# Patient Record
Sex: Male | Born: 1980 | Race: White | Hispanic: No | Smoking: Current some day smoker
Health system: Southern US, Community
[De-identification: ages and names within clinical notes are randomized; demographics above are authoritative.]

## PROBLEM LIST (undated history)

## (undated) DIAGNOSIS — J45909 Unspecified asthma, uncomplicated: Secondary | ICD-10-CM

## (undated) HISTORY — PX: ADENOIDECTOMY: SUR15

---

## 2007-03-22 ENCOUNTER — Emergency Department: Payer: Self-pay | Admitting: Emergency Medicine

## 2007-07-04 ENCOUNTER — Emergency Department: Payer: Self-pay | Admitting: Emergency Medicine
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
## 2016-06-27 ENCOUNTER — Institutional Professional Consult (permissible substitution): Payer: Self-pay | Admitting: Pulmonary Disease

## 2016-07-08 ENCOUNTER — Institutional Professional Consult (permissible substitution): Payer: Self-pay | Admitting: Internal Medicine

## 2016-07-10 ENCOUNTER — Institutional Professional Consult (permissible substitution): Payer: Self-pay | Admitting: Internal Medicine

## 2016-07-22 NOTE — Progress Notes (Signed)
[facility]      Assessment and Plan:  Lung Nodule --on review of CXR from 06/21/16; there is a small l(about 5mm) eft lung nodule, however he was told by urgent care that he has a 2.5 cm nodule. Will give to our radiologist for interpretation and schedule follow up in 6 months.   Nicotine Abuse --discussed > 3 min regarding smoking cessation.   Acute bronchitis.  --Doing better but not quite back to baseline.  --Given samples of dulera 100, asked to use 2 puffs twice daily.  --Can give prescription if he still feels that he needs them when they run out in about a month.   Date: 07/22/2016  MRN# [id number] Gregory PartridgeMichael P Schlossberg Jr. [birth year]  Referring Physician:   Asencion PartridgeMichael P Cadena Jr. is a 35 y.o. old male seen in consultation for chief complaint of:    Chief Complaint  Patient presents with  . Advice Only    bronchitis: UC found area on lung: NP cough: SOB previously: chest pain in center of chest    HPI:   Pt is a self referral, she went to urgent care with bronchitis symptoms and had imaging which apparently showed a spot on her lung.  He had an episode of bronchitis with dyspnea, bad cough and chest pain. He got to the point that he could not breathe. He went to urgent care and got put on zpack, a prn inhaler of some some which he used for a week every hour. He also had a CXR which he was told that was abnormal.   He is smoking about ppd;  He has quit for a week in the past with patches, he has worked in [employer] in the past, and he works in [employer] and has been exposed to asbestos.   He has never been diagnosed with asthma or COPD. His dad had epilepsy and asthma. He has no immediate family members with cancer.   CT chest report 07/04/07 report, no images available. Negative CT of the chest, abdomen and pelvis.  Specifically, there is no  posttraumatic change. The mediastinum and thoracic aorta are normal. There  is no pneumothorax. No  intraabdominal or intrapelvic significant  abnormalities are identified.   PMHX:   History reviewed. No pertinent past medical history. Surgical Hx:  Past Surgical History:  Procedure Laterality Date  . ADENOIDECTOMY     Family Hx:  Family History  Problem Relation Age of Onset  . Asthma Maternal Grandmother   . Lung cancer Maternal Grandmother   . Emphysema Maternal Grandmother   . Asthma Paternal Grandmother   . Lung cancer Paternal Grandmother   . Emphysema Paternal Grandmother    Social Hx:   Social History  Substance Use Topics  . Smoking status: Not on file  . Smokeless tobacco: Not on file  . Alcohol use Not on file   Medication:       Allergies:  Patient has no known allergies.  Review of Systems: Gen:  Denies  fever, sweats, chills HEENT: Denies blurred vision, double vision. bleeds, sore throat Cvc:  No dizziness, chest pain. Resp:   Denies cough or sputum production, shortness of breath Gi: Denies swallowing difficulty, stomach pain. Gu:  Denies bladder incontinence, burning urine Ext:   No Joint pain, stiffness. Skin: No skin rash,  hives  Endoc:  No polyuria, polydipsia. Psych: No depression, insomnia. Other:  All other systems were reviewed with the patient and were negative other that what is  mentioned in the HPI.   Physical Examination:   VS: BP 118/60 (BP Location: Left Arm, Cuff Size: Normal)   Pulse 72   Ht 5\' 9"  (1.753 m)   Wt 149 lb (67.6 kg)   SpO2 96%   BMI 22.00 kg/m   General Appearance: No distress. Rash on anterior and posterior torso.  Neuro:without focal findings,  speech normal,  HEENT: PERRLA, EOM intact.   Pulmonary: normal breath sounds, No wheezing.  CardiovascularNormal S1,S2.  No m/r/g.   Abdomen: Benign, Soft, non-tender. Renal:  No costovertebral tenderness  GU:  No performed at this time. Endoc: No evident thyromegaly, no signs of acromegaly. Skin:   warm, no rashes, no ecchymosis  Extremities: normal, no  cyanosis, clubbing.  Other findings:    LABORATORY PANEL:   CBC No results for input(s): WBC, HGB, HCT, PLT in the last 168 hours. ------------------------------------------------------------------------------------------------------------------  Chemistries  No results for input(s): NA, K, CL, CO2, GLUCOSE, BUN, CREATININE, CALCIUM, MG, AST, ALT, ALKPHOS, BILITOT in the last 168 hours.  Invalid input(s): GFRCGP ------------------------------------------------------------------------------------------------------------------  Cardiac Enzymes No results for input(s): TROPONINI in the last 168 hours. ------------------------------------------------------------  RADIOLOGY:  No results found.     Thank  you for the consultation and for allowing [facility] to assist in the care of your patient. Our recommendations are noted above.  Please contact us if we can be of further service.   Wells Guileseep Alice Burnside, MD.  Board Certified in Internal Medicine, Pulmonary Medicine, Critical Care Medicine, and Sleep Medicine.  [facility] Office Number: [phone]  Santiago Gladavid Kasa, M.D.  Stephanie AcreVishal Mungal, M.D.  Billy Fischeravid Simonds, M.D  07/22/2016

## 2016-07-23 ENCOUNTER — Encounter: Payer: Self-pay | Admitting: Internal Medicine

## 2016-07-23 ENCOUNTER — Ambulatory Visit (INDEPENDENT_AMBULATORY_CARE_PROVIDER_SITE_OTHER): Payer: Self-pay | Admitting: Internal Medicine

## 2016-07-23 ENCOUNTER — Encounter (INDEPENDENT_AMBULATORY_CARE_PROVIDER_SITE_OTHER): Payer: Self-pay

## 2016-07-23 VITALS — BP 118/60 | HR 72 | Ht 69.0 in | Wt 149.0 lb

## 2016-07-23 DIAGNOSIS — Z720 Tobacco use: Secondary | ICD-10-CM

## 2016-07-23 DIAGNOSIS — J209 Acute bronchitis, unspecified: Secondary | ICD-10-CM

## 2016-07-23 MED ORDER — MOMETASONE FURO-FORMOTEROL FUM 100-5 MCG/ACT IN AERO: 2.0000 | INHALATION_SPRAY | Freq: Two times a day (BID) | RESPIRATORY_TRACT | 0 refills | Status: DC

## 2016-07-23 NOTE — Patient Instructions (Addendum)
--  Quitting smoking is the most important thing that you can do for your health.   --The best way to quit is to set a quit date, start a nicotine replacement 1 week before.   --Will start dulera 100 samples 2 puffs twice daily. Rinse mouth or brush teeth after use.

## 2016-07-24 ENCOUNTER — Ambulatory Visit
Admission: RE | Admit: 2016-07-24 | Discharge: 2016-07-24 | Disposition: A | Discharge status: Home / Self Care | Payer: Self-pay | Source: Ambulatory Visit | Attending: Internal Medicine | Admitting: Internal Medicine

## 2016-07-24 ENCOUNTER — Other Ambulatory Visit: Payer: Self-pay | Admitting: Internal Medicine

## 2016-07-24 DIAGNOSIS — R0602 Shortness of breath: Secondary | ICD-10-CM

## 2017-01-16 ENCOUNTER — Inpatient Hospital Stay
Admission: EM | Admit: 2017-01-16 | Discharge: 2017-01-18 | DRG: 603 | Disposition: A | Discharge status: Home / Self Care | Payer: Self-pay | Attending: Internal Medicine | Admitting: Internal Medicine

## 2017-01-16 ENCOUNTER — Encounter: Payer: Self-pay | Admitting: Internal Medicine

## 2017-01-16 DIAGNOSIS — J45909 Unspecified asthma, uncomplicated: Secondary | ICD-10-CM | POA: Diagnosis present

## 2017-01-16 DIAGNOSIS — Z825 Family history of asthma and other chronic lower respiratory diseases: Secondary | ICD-10-CM

## 2017-01-16 DIAGNOSIS — L0292 Furuncle, unspecified: Secondary | ICD-10-CM | POA: Diagnosis present

## 2017-01-16 DIAGNOSIS — Z801 Family history of malignant neoplasm of trachea, bronchus and lung: Secondary | ICD-10-CM

## 2017-01-16 DIAGNOSIS — L03211 Cellulitis of face: Principal | ICD-10-CM | POA: Diagnosis present

## 2017-01-16 DIAGNOSIS — Z7951 Long term (current) use of inhaled steroids: Secondary | ICD-10-CM

## 2017-01-16 DIAGNOSIS — F17200 Nicotine dependence, unspecified, uncomplicated: Secondary | ICD-10-CM | POA: Diagnosis present

## 2017-01-16 HISTORY — DX: Unspecified asthma, uncomplicated: J45.909

## 2017-01-16 LAB — CBC WITH DIFFERENTIAL/PLATELET
Basophils Absolute: 0.1 10*3/uL (ref 0–0.1)
Basophils Relative: 0 %
Eosinophils Absolute: 1 10*3/uL — ABNORMAL HIGH (ref 0–0.7)
Eosinophils Relative: 7 %
HEMATOCRIT: 39.6 % — AB (ref 40.0–52.0)
Hemoglobin: 13.5 g/dL (ref 13.0–18.0)
LYMPHS ABS: 1.8 10*3/uL (ref 1.0–3.6)
LYMPHS PCT: 12 %
MCH: 32.6 pg (ref 26.0–34.0)
MCHC: 34.1 g/dL (ref 32.0–36.0)
MCV: 95.7 fL (ref 80.0–100.0)
MONO ABS: 1.6 10*3/uL — AB (ref 0.2–1.0)
Monocytes Relative: 11 %
NEUTROS ABS: 10.5 10*3/uL — AB (ref 1.4–6.5)
Neutrophils Relative %: 70 %
Platelets: 254 10*3/uL (ref 150–440)
RBC: 4.14 MIL/uL — ABNORMAL LOW (ref 4.40–5.90)
RDW: 13.6 % (ref 11.5–14.5)
WBC: 15 10*3/uL — ABNORMAL HIGH (ref 3.8–10.6)

## 2017-01-16 LAB — BASIC METABOLIC PANEL
ANION GAP: 5 (ref 5–15)
BUN: 11 mg/dL (ref 6–20)
CHLORIDE: 103 mmol/L (ref 101–111)
CO2: 32 mmol/L (ref 22–32)
Calcium: 8.8 mg/dL — ABNORMAL LOW (ref 8.9–10.3)
Creatinine, Ser: 0.8 mg/dL (ref 0.61–1.24)
GFR calc Af Amer: >60 mL/min (ref >60)
GLUCOSE: 99 mg/dL (ref 65–99)
POTASSIUM: 3.9 mmol/L (ref 3.5–5.1)
SODIUM: 140 mmol/L (ref 135–145)

## 2017-01-16 MED ORDER — ONDANSETRON HCL 4 MG PO TABS: 4.0000 mg | ORAL_TABLET | Freq: Four times a day (QID) | ORAL | Status: DC | PRN

## 2017-01-16 MED ORDER — CLINDAMYCIN PHOSPHATE 600 MG/50ML IV SOLN: 600.0000 mg | Freq: Once | INTRAVENOUS | Status: DC

## 2017-01-16 MED ORDER — OXYCODONE HCL 5 MG PO TABS
5.0000 mg | ORAL_TABLET | ORAL | Status: DC | PRN
  Administered 2017-01-16 – 2017-01-18 (×8): 5 mg via ORAL
  Filled 2017-01-16 (×8): qty 1

## 2017-01-16 MED ORDER — SODIUM CHLORIDE 0.9 % IV BOLUS (SEPSIS)
1000.0000 mL | Freq: Once | INTRAVENOUS | Status: AC
  Administered 2017-01-16: 1000 mL via INTRAVENOUS

## 2017-01-16 MED ORDER — PIPERACILLIN-TAZOBACTAM 3.375 G IVPB 30 MIN
3.3750 g | Freq: Once | INTRAVENOUS | Status: AC
  Administered 2017-01-16: 3.375 g via INTRAVENOUS
  Filled 2017-01-16: qty 50

## 2017-01-16 MED ORDER — VANCOMYCIN HCL IN DEXTROSE 1-5 GM/200ML-% IV SOLN
1000.0000 mg | Freq: Once | INTRAVENOUS | Status: AC
  Administered 2017-01-16: 1000 mg via INTRAVENOUS
  Filled 2017-01-16: qty 200

## 2017-01-16 MED ORDER — VANCOMYCIN HCL IN DEXTROSE 1-5 GM/200ML-% IV SOLN
1000.0000 mg | Freq: Three times a day (TID) | INTRAVENOUS | Status: DC
  Administered 2017-01-17 – 2017-01-18 (×4): 1000 mg via INTRAVENOUS
  Filled 2017-01-16 (×6): qty 200

## 2017-01-16 MED ORDER — MORPHINE SULFATE (PF) 4 MG/ML IV SOLN
4.0000 mg | Freq: Once | INTRAVENOUS | Status: AC
  Administered 2017-01-16: 4 mg via INTRAVENOUS
  Filled 2017-01-16: qty 1

## 2017-01-16 MED ORDER — ACETAMINOPHEN 650 MG RE SUPP: 650.0000 mg | Freq: Four times a day (QID) | RECTAL | Status: DC | PRN

## 2017-01-16 MED ORDER — ACETAMINOPHEN 325 MG PO TABS
650.0000 mg | ORAL_TABLET | Freq: Four times a day (QID) | ORAL | Status: DC | PRN
  Administered 2017-01-17: 650 mg via ORAL
  Filled 2017-01-16: qty 2

## 2017-01-16 MED ORDER — PIPERACILLIN-TAZOBACTAM 3.375 G IVPB
3.3750 g | Freq: Three times a day (TID) | INTRAVENOUS | Status: DC
  Administered 2017-01-17 – 2017-01-18 (×4): 3.375 g via INTRAVENOUS
  Filled 2017-01-16 (×6): qty 50

## 2017-01-16 MED ORDER — MORPHINE SULFATE (PF) 4 MG/ML IV SOLN
4.0000 mg | INTRAVENOUS | Status: DC | PRN
Start: 2017-01-16 — End: 2017-01-18
  Administered 2017-01-17 – 2017-01-18 (×4): 4 mg via INTRAVENOUS
  Filled 2017-01-16 (×4): qty 1

## 2017-01-16 MED ORDER — ONDANSETRON HCL 4 MG/2ML IJ SOLN: 4.0000 mg | Freq: Four times a day (QID) | INTRAMUSCULAR | Status: DC | PRN

## 2017-01-16 MED ORDER — ENOXAPARIN SODIUM 40 MG/0.4ML ~~LOC~~ SOLN
40.0000 mg | SUBCUTANEOUS | Status: DC
  Administered 2017-01-16: 40 mg via SUBCUTANEOUS
  Filled 2017-01-16: qty 0.4

## 2017-01-16 MED ORDER — MOMETASONE FURO-FORMOTEROL FUM 100-5 MCG/ACT IN AERO
2.0000 | INHALATION_SPRAY | Freq: Two times a day (BID) | RESPIRATORY_TRACT | Status: DC
  Administered 2017-01-17 (×2): 2 via RESPIRATORY_TRACT
  Filled 2017-01-16: qty 8.8

## 2017-01-16 NOTE — ED Notes (Signed)
Large amount swelling noted to right side of face around right eye; charge called for exam room

## 2017-01-16 NOTE — ED Triage Notes (Addendum)
Pt presents to ED with right sided facial swelling. Pt reports pain in right jaw but denies having dental problems. Pt reports having had a similar reaction to the last time he used an Neurosurgeonelectric razor. Pt has small area of broken skin on cheek that is red and swollen. Swelling noted around pts eye. Pt deneis visual changes. PT denies pain in throat, neck or when swallowing. No SOB or difficulty breathing.

## 2017-01-16 NOTE — ED Notes (Signed)
Zosyn complete. vanco attached and clamped and ready for transport to the floor.

## 2017-01-16 NOTE — Progress Notes (Addendum)
Pharmacy Antibiotic Note  Gregory PartridgeMichael P Penrod Jr. is a 36 y.o. male admitted on 01/16/2017 with cellulitis.  Pharmacy has been consulted for vancomycin and Zosyn dosing.  Plan: 73 kg   Vd 51L kei 0.1 hr-1  t1/2 7 hours Vancomycin 1 gram q 8 hours ordered with stacked dosing. Level before 5th dose.  Goal trough 10-15.  Zosyn 3.375 grams q 8 hours ordered.    Height: 5\' 9"  (175.3 cm) Weight: 150 lb (68 kg) IBW/kg (Calculated) : 70.7  Temp (24hrs), Avg:97.9 F (36.6 C), Min:97.9 F (36.6 C), Max:97.9 F (36.6 C)   Recent Labs Lab 01/16/17 2100  WBC 15.0*  CREATININE 0.80    Estimated Creatinine Clearance: 121.6 mL/min (by C-G formula based on SCr of 0.8 mg/dL).    No Known Allergies  Antimicrobials this admission: vancomycin Zosyn >>    >>   Dose adjustments this admission: 5/5 03:30 vanc level 21, ~ 5 hour level. Will reschedule level before 14:00 dose for a more representative trough.   Microbiology results: 5/3 WoundCx: pending    Thank you for allowing pharmacy to be a part of this patient's care.  Aseel Truxillo S 01/16/2017 10:23 PM

## 2017-01-16 NOTE — ED Notes (Signed)
Pt given ginger ale, and medicated as ordered.

## 2017-01-16 NOTE — ED Provider Notes (Signed)
[facility] Emergency Department Provider Note  ____________________________________________  Time seen: Approximately 10:16 PM  I have reviewed the triage vital signs and the nursing notes.   HISTORY  Chief Complaint Facial Swelling    HPI Gregory PartridgeMichael P Booz Jr. is a 36 y.o. male reports worsening swelling and pain of the face over the past couple of days. Reports that from skin cuts from shaving he first developed a swollen area under the left side of the jaw. Then since last night is been having more pain and swelling under the right side of the jaw on the right side of the face extending up to below the eye. Denies any vision changes or eye pain. No pain with movement of the eyes. No headache or dizziness .     Past Medical History:  Diagnosis Date  . Asthma      Patient Active Problem List   Diagnosis Date Noted  . Facial cellulitis 01/16/2017  . Asthma 01/16/2017     Past Surgical History:  Procedure Laterality Date  . ADENOIDECTOMY       Prior to Admission medications   Not on File  None   Allergies Patient has no known allergies.   Family History  Problem Relation Age of Onset  . Asthma Maternal Grandmother   . Lung cancer Maternal Grandmother   . Emphysema Maternal Grandmother   . Asthma Paternal Grandmother   . Lung cancer Paternal Grandmother   . Emphysema Paternal Grandmother     Social History Social History  Substance Use Topics  . Smoking status: Current Some Day Smoker    Packs/day: 1.00  . Smokeless tobacco: Never Used  . Alcohol use No    Review of Systems  Constitutional:   No fever or chills.  ENT:   No sore throat. No rhinorrhea.Positive facial pain. Difficulty eating due to pain with movement of the jaw Lymphatic: No swollen glands, No extremity swelling Endocrine: No hot/cold flashes. No significant weight change. No neck swelling. Cardiovascular:   No chest pain or syncope. Respiratory:   No  dyspnea or cough. Gastrointestinal:   Negative for abdominal pain, vomiting and diarrhea.  Genitourinary:   Negative for dysuria or difficulty urinating. Musculoskeletal:   Negative for focal pain or swelling Neurological:   Negative for headaches or weakness. All other systems reviewed and are negative except as documented above in ROS and HPI.  ____________________________________________   PHYSICAL EXAM:  VITAL SIGNS: ED Triage Vitals  Enc Vitals Group     BP 01/16/17 2018 131/66     Pulse Rate 01/16/17 2018 86     Resp 01/16/17 2018 16     Temp 01/16/17 2018 97.9 F (36.6 C)     Temp Source 01/16/17 2018 Oral     SpO2 01/16/17 2018 100 %     Weight 01/16/17 2019 160 lb (72.6 kg)     Height 01/16/17 2019 5\' 9"  (1.753 m)     Head Circumference --      Peak Flow --      Pain Score 01/16/17 2018 7     Pain Loc --      Pain Edu? --      Excl. in GC? --     Vital signs reviewed, nursing assessments reviewed.   Constitutional:   Alert and oriented. Not in distress. Eyes:   No scleral icterus. No conjunctival pallor. PERRL. EOMI.  No nystagmus. ENT   Head:   Normocephalic and atraumatic. 2-3 cm nodular  mass in the left submandibular space, possibly early organizing abscess but without a significant fluid collection for drainage. Right face has an area of purulent drainage and tenderness on the right maxilla just lateral to the naris. There is some slight puffy swelling extending up to the lower eyelid on the right. Globe is uninvolved and nontender.    Nose:   No congestion/rhinnorhea. No septal hematoma   Mouth/Throat:   MMM, no pharyngeal erythema. No peritonsillar mass.    Neck:   No stridor. No SubQ emphysema. No meningismus. Hematological/Lymphatic/Immunilogical:   No cervical lymphadenopathy. Cardiovascular:   RRR. Symmetric bilateral radial and DP pulses.  No murmurs.  Respiratory:   Normal respiratory effort without tachypnea nor retractions. Breath sounds  are clear and equal bilaterally. No wheezes/rales/rhonchi. Gastrointestinal:   Soft and nontender. Non distended. There is no CVA tenderness.  No rebound, rigidity, or guarding. Genitourinary:   deferred Musculoskeletal:   Normal range of motion in all extremities. No joint effusions.  No lower extremity tenderness.  No edema. Neurologic:   Normal speech and language.  CN 2-10 normal. Motor grossly intact. No gross focal neurologic deficits are appreciated.  Skin:    Skin is warm, dry and intact. No rash noted.  No petechiae, purpura, or bullae.  ____________________________________________    LABS (pertinent positives/negatives) (all labs ordered are listed, but only abnormal results are displayed) Labs Reviewed  BASIC METABOLIC PANEL - Abnormal; Notable for the following:       Result Value   Calcium 8.8 (*)    All other components within normal limits  CBC WITH DIFFERENTIAL/PLATELET - Abnormal; Notable for the following:    WBC 15.0 (*)    RBC 4.14 (*)    HCT 39.6 (*)    Neutro Abs 10.5 (*)    Monocytes Absolute 1.6 (*)    Eosinophils Absolute 1.0 (*)    All other components within normal limits  AEROBIC CULTURE (SUPERFICIAL SPECIMEN)  CBC  CREATININE, SERUM  BASIC METABOLIC PANEL  CBC   ____________________________________________   EKG    ____________________________________________    RADIOLOGY  No results found.  ____________________________________________   PROCEDURES Procedures  ____________________________________________   INITIAL IMPRESSION / ASSESSMENT AND PLAN / ED COURSE  Pertinent labs & imaging results that were available during my care of the patient were reviewed by me and considered in my medical decision making (see chart for details).       Clinical Course as of Jan 17 2215  Thu Jan 16, 2017  2121 Facial cellulitis with purulent drainage. Clinically no orbital cellulitis. Will start vanc/zosyn. Plan to admit.   [PS]     Clinical Course User Index [PS] Sharman Cheek, MD     ____________________________________________   FINAL CLINICAL IMPRESSION(S) / ED DIAGNOSES  Final diagnoses:  Facial cellulitis      Current Discharge Medication List       Portions of this note were generated with dragon dictation software. Dictation errors may occur despite best attempts at proofreading.    Sharman Cheek, MD 01/16/17 2219

## 2017-01-16 NOTE — H&P (Signed)
[facility] [place] [facility]   PATIENT NAME: [name]    MR#:  [id number]  DATE OF BIRTH:  [birth year]  DATE OF ADMISSION:  01/16/2017  PRIMARY CARE PHYSICIAN: No PCP Per Patient   REQUESTING/REFERRING PHYSICIAN: Scotty CourtStafford, MD  CHIEF COMPLAINT:   Chief Complaint  Patient presents with  . Facial Swelling    HISTORY OF PRESENT ILLNESS:  [name]  is a 36 y.o. male who presents with Facial swelling and erythema. Patient states that he has been using some clippers to check his facial hair, and over the last week or so has developed multiple areas of erythema and pus draining lumps on his face. Tonight he had significant right facial erythema and swelling with some drainage. Swelling and erythema reach up to the periorbital region, but he denies any symptoms of orbital involvement. Hospitalists were called for admission.  PAST MEDICAL HISTORY:   Past Medical History:  Diagnosis Date  . Asthma     PAST SURGICAL HISTORY:   Past Surgical History:  Procedure Laterality Date  . ADENOIDECTOMY      SOCIAL HISTORY:   Social History  Substance Use Topics  . Smoking status: Current Some Day Smoker    Packs/day: 1.00  . Smokeless tobacco: Never Used  . Alcohol use No    FAMILY HISTORY:   Family History  Problem Relation Age of Onset  . Asthma Maternal Grandmother   . Lung cancer Maternal Grandmother   . Emphysema Maternal Grandmother   . Asthma Paternal Grandmother   . Lung cancer Paternal Grandmother   . Emphysema Paternal Grandmother     DRUG ALLERGIES:  No Known Allergies  MEDICATIONS AT HOME:   Prior to Admission medications   Medication Sig Start Date End Date Taking? Authorizing Provider  mometasone-formoterol (DULERA) 100-5 MCG/ACT AERO Inhale 2 puffs into the lungs 2 (two) times daily. 07/23/16 07/24/16  Shane CrutchPradeep Ramachandran, MD    REVIEW OF SYSTEMS:  Review of Systems  Constitutional: Negative for chills, fever,  malaise/fatigue and weight loss.  HENT: Negative for ear pain, hearing loss and tinnitus.   Eyes: Negative for blurred vision, double vision, pain and redness.  Respiratory: Negative for cough, hemoptysis and shortness of breath.   Cardiovascular: Negative for chest pain, palpitations, orthopnea and leg swelling.  Gastrointestinal: Negative for abdominal pain, constipation, diarrhea, nausea and vomiting.  Genitourinary: Negative for dysuria, frequency and hematuria.  Musculoskeletal: Negative for back pain, joint pain and neck pain.  Skin:       Facial erythema, tenderness, drainage  Neurological: Negative for dizziness, tremors, focal weakness and weakness.  Endo/Heme/Allergies: Negative for polydipsia. Does not bruise/bleed easily.  Psychiatric/Behavioral: Negative for depression. The patient is not nervous/anxious and does not have insomnia.      VITAL SIGNS:   Vitals:   01/16/17 2018 01/16/17 2019  BP: 131/66   Pulse: 86   Resp: 16   Temp: 97.9 F (36.6 C)   TempSrc: Oral   SpO2: 100%   Weight:  72.6 kg (160 lb)  Height:  5\' 9"  (1.753 m)   Wt Readings from Last 3 Encounters:  01/16/17 72.6 kg (160 lb)  07/23/16 67.6 kg (149 lb)    PHYSICAL EXAMINATION:  Physical Exam  Vitals reviewed. Constitutional: He is oriented to person, place, and time. He appears well-developed and well-nourished. No distress.  HENT:  Head: Normocephalic and atraumatic.  Mouth/Throat: Oropharynx is clear and moist.  Eyes: Conjunctivae and EOM are normal. Pupils are equal,  round, and reactive to light. No scleral icterus.  Neck: Normal range of motion. Neck supple. No JVD present. No thyromegaly present.  Cardiovascular: Normal rate, regular rhythm and intact distal pulses.  Exam reveals no gallop and no friction rub.   No murmur heard. Respiratory: Effort normal and breath sounds normal. No respiratory distress. He has no wheezes. He has no rales.  GI: Soft. Bowel sounds are normal. He  exhibits no distension. There is no tenderness.  Musculoskeletal: Normal range of motion. He exhibits no edema.  No arthritis, no gout  Lymphadenopathy:    He has no cervical adenopathy.  Neurological: He is alert and oriented to person, place, and time. No cranial nerve deficit.  No dysarthria, no aphasia  Skin: Skin is warm and dry. No rash noted. There is erythema (Right face, with tenderness and drainage).  Psychiatric: He has a normal mood and affect. His behavior is normal. Judgment and thought content normal.    LABORATORY PANEL:   CBC  Recent Labs Lab 01/16/17 2100  WBC 15.0*  HGB 13.5  HCT 39.6*  PLT 254   ------------------------------------------------------------------------------------------------------------------  Chemistries   Recent Labs Lab 01/16/17 2100  NA 140  K 3.9  CL 103  CO2 32  GLUCOSE 99  BUN 11  CREATININE 0.80  CALCIUM 8.8*   ------------------------------------------------------------------------------------------------------------------  Cardiac Enzymes No results for input(s): TROPONINI in the last 168 hours. ------------------------------------------------------------------------------------------------------------------  RADIOLOGY:  No results found.  EKG:  No orders found for this or any previous visit.  IMPRESSION AND PLAN:  Principal Problem:   Facial cellulitis - broad IV antibiotics initiated.  Low suspicion for any orbital involvement, we'll monitor closely Active Problems:   Asthma - home dose inhalers  All the records are reviewed and case discussed with ED provider. Management plans discussed with the patient and/or family.  DVT PROPHYLAXIS: SubQ lovenox  GI PROPHYLAXIS: None  ADMISSION STATUS: Inpatient  CODE STATUS: Full Code Status History    This patient does not have a recorded code status. Please follow your organizational policy for patients in this situation.      TOTAL TIME TAKING CARE OF THIS  PATIENT: 45 minutes.   Khalfani Weideman FIELDING 01/16/2017, 9:47 PM  [facility]  Office  [phone]  CC: Primary care physician; No PCP Per Patient  Note:  This document was prepared using Dragon voice recognition software and may include unintentional dictation errors.

## 2017-01-17 LAB — CBC
HCT: 37.9 % — ABNORMAL LOW (ref 40.0–52.0)
Hemoglobin: 12.9 g/dL — ABNORMAL LOW (ref 13.0–18.0)
MCH: 32.5 pg (ref 26.0–34.0)
MCHC: 34 g/dL (ref 32.0–36.0)
MCV: 95.7 fL (ref 80.0–100.0)
PLATELETS: 250 10*3/uL (ref 150–440)
RBC: 3.96 MIL/uL — AB (ref 4.40–5.90)
RDW: 13.7 % (ref 11.5–14.5)
WBC: 14.8 10*3/uL — ABNORMAL HIGH (ref 3.8–10.6)

## 2017-01-17 LAB — BASIC METABOLIC PANEL
Anion gap: 3 — ABNORMAL LOW (ref 5–15)
BUN: 12 mg/dL (ref 6–20)
CALCIUM: 8.3 mg/dL — AB (ref 8.9–10.3)
CHLORIDE: 106 mmol/L (ref 101–111)
CO2: 31 mmol/L (ref 22–32)
CREATININE: 0.74 mg/dL (ref 0.61–1.24)
GFR calc non Af Amer: >60 mL/min (ref >60)
GLUCOSE: 102 mg/dL — AB (ref 65–99)
Potassium: 3.8 mmol/L (ref 3.5–5.1)
Sodium: 140 mmol/L (ref 135–145)

## 2017-01-17 MED ORDER — DOCUSATE SODIUM 100 MG PO CAPS: 100.0000 mg | ORAL_CAPSULE | Freq: Two times a day (BID) | ORAL | Status: DC

## 2017-01-17 NOTE — Progress Notes (Signed)
[facility] - [place] [facility]   PATIENT NAME: [name]    MR#:  [id number]  DATE OF BIRTH:  [birth year]  SUBJECTIVE:  CHIEF COMPLAINT:   Chief Complaint  Patient presents with  . Facial Swelling   Improved significantly. Able to open right eye nornmally. No trouble swallowing. No change in hearing/vision  REVIEW OF SYSTEMS:    Review of Systems  Constitutional: Positive for malaise/fatigue. Negative for chills and fever.  HENT: Negative for sore throat.   Eyes: Negative for blurred vision, double vision and pain.  Respiratory: Negative for cough, hemoptysis, shortness of breath and wheezing.   Cardiovascular: Negative for chest pain, palpitations, orthopnea and leg swelling.  Gastrointestinal: Negative for abdominal pain, constipation, diarrhea, heartburn, nausea and vomiting.  Genitourinary: Negative for dysuria and hematuria.  Musculoskeletal: Negative for back pain and joint pain.  Skin: Negative for rash.  Neurological: Positive for weakness. Negative for sensory change, speech change, focal weakness and headaches.  Endo/Heme/Allergies: Does not bruise/bleed easily.  Psychiatric/Behavioral: Negative for depression. The patient is not nervous/anxious.     DRUG ALLERGIES:  No Known Allergies  VITALS:  Blood pressure (!) 107/51, pulse 75, temperature 98.3 F (36.8 C), temperature source Oral, resp. rate 16, height 5\' 9"  (1.753 m), weight 68 kg (150 lb), SpO2 97 %.  PHYSICAL EXAMINATION:   Physical Exam  GENERAL:  36 y.o.-year-old patient lying in the bed with no acute distress.  EYES: Pupils equal, round, reactive to light and accommodation. No scleral icterus. Extraocular muscles intact.  HEENT: Head atraumatic, normocephalic.  Right face red and warm. Mild induration over cheek NECK:  Supple, no jugular venous distention. No thyroid enlargement, no tenderness.  LUNGS: Normal breath sounds bilaterally, no wheezing, rales, rhonchi. No use  of accessory muscles of respiration.  CARDIOVASCULAR: S1, S2 normal. No murmurs, rubs, or gallops.  ABDOMEN: Soft, nontender, nondistended. Bowel sounds present. No organomegaly or mass.  EXTREMITIES: No cyanosis, clubbing or edema b/l.    NEUROLOGIC: Cranial nerves II through XII are intact. No focal Motor or sensory deficits b/l.   PSYCHIATRIC: The patient is alert and oriented x 3.  SKIN: No obvious rash, lesion, or ulcer.   LABORATORY PANEL:   CBC  Recent Labs Lab 01/17/17 0508  WBC 14.8*  HGB 12.9*  HCT 37.9*  PLT 250   ------------------------------------------------------------------------------------------------------------------ Chemistries   Recent Labs Lab 01/17/17 0508  NA 140  K 3.8  CL 106  CO2 31  GLUCOSE 102*  BUN 12  CREATININE 0.74  CALCIUM 8.3*   ------------------------------------------------------------------------------------------------------------------  Cardiac Enzymes No results for input(s): TROPONINI in the last 168 hours. ------------------------------------------------------------------------------------------------------------------  RADIOLOGY:  No results found.   ASSESSMENT AND PLAN:   *  Facial cellulitis Improving with IV zosyn and vancomycin. No discharge. Likely d/c tomorrow on PO abx  *  Asthma - home dose inhalers   All the records are reviewed and case discussed with Care Management/Social Worker Management plans discussed with the patient, family and they are in agreement.  CODE STATUS: FULL CODE  DVT Prophylaxis: SCDs  TOTAL TIME TAKING CARE OF THIS PATIENT: 35 minutes.   POSSIBLE D/C IN 1-2 DAYS, DEPENDING ON CLINICAL CONDITION.  Milagros LollSudini, [name] R M.D on 01/17/2017 at 11:14 AM  Between 7am to 6pm - Pager - [phone]  After 6pm go to [email or web address] - password [email or web address] [facility]  [facility] [place] [facility]  Office  [phone]  CC: Primary care physician; No PCP Per Patient  Note: This dictation was  prepared with Dragon dictation along with smaller phrase technology. Any transcriptional errors that result from this process are unintentional.

## 2017-01-18 LAB — VANCOMYCIN, TROUGH: VANCOMYCIN TR: 21 ug/mL — AB (ref 15–20)

## 2017-01-18 MED ORDER — SULFAMETHOXAZOLE-TRIMETHOPRIM 800-160 MG PO TABS: 1.0000 | ORAL_TABLET | Freq: Two times a day (BID) | ORAL | 0 refills | Status: AC

## 2017-01-18 MED ORDER — IBUPROFEN 400 MG PO TABS: 400.0000 mg | ORAL_TABLET | Freq: Three times a day (TID) | ORAL | Status: DC | PRN

## 2017-01-18 MED ORDER — SULFAMETHOXAZOLE-TRIMETHOPRIM 800-160 MG PO TABS: 1.0000 | ORAL_TABLET | Freq: Two times a day (BID) | ORAL | Status: DC

## 2017-01-18 MED ORDER — IBUPROFEN 400 MG PO TABS: 400.0000 mg | ORAL_TABLET | Freq: Three times a day (TID) | ORAL | 0 refills | Status: AC | PRN

## 2017-01-18 NOTE — Care Management Note (Signed)
Case Management Note  Patient Details  Name: Asencion PartridgeMichael P Fauteux Jr. MRN: [id number] Date of Birth: 1981/01/27  Subjective/Objective:     Discharge to home with no home health needs. [facility]               Action/Plan:   Expected Discharge Date:  01/18/17               Expected Discharge Plan:   01/18/17  In-House Referral:     Discharge planning Services   Referral to [facility] for $4.00 medication.   Post Acute Care Choice:   NA Choice offered to:   NA  DME Arranged:   NA DME Agency:   NA  HH Arranged:   NA HH Agency:   NA  Status of Service:   Discharged to home on 01/18/17 with Rx [facility].   If discussed at Long Length of Stay Meetings, dates discussed:    Additional Comments:  Karin Pinedo A, RN 01/18/2017, 12:22 PM

## 2017-01-18 NOTE — Discharge Summary (Signed)
[facility] [place] at [facility]   PATIENT NAME: [name]    MR#:  [id number]  DATE OF BIRTH:  1981-07-16  DATE OF ADMISSION:  01/16/2017 ADMITTING PHYSICIAN: Oralia Manis, MD  DATE OF DISCHARGE: 01/18/17  PRIMARY CARE PHYSICIAN: Patient, No Pcp Per    ADMISSION DIAGNOSIS:  facial swelling  DISCHARGE DIAGNOSIS:  Right facial cellulitis with furunculosis  SECONDARY DIAGNOSIS:   Past Medical History:  Diagnosis Date  . Asthma     HOSPITAL COURSE:  [name]  is a 36 y.o. male who presents with Facial swelling and erythema. Patient states that he has been using some clippers to check his facial hair, and over the last week or so has developed multiple areas of erythema and pus draining lumps on his face. Tonight he had significant right facial erythema and swelling with some drainage  *Facial cellulitis with right furunculosis (scabbed lesion) Improving with IV zosyn and vancomycin. -WC GPC (too small to read) -change to oral bactrim No discharge. Prn ibuprofen  *Asthma - home dose inhalers   Improving Pt advised skin and hand hygiene  D/c home CONSULTS OBTAINED:    DRUG ALLERGIES:  No Known Allergies  DISCHARGE MEDICATIONS:   Current Discharge Medication List    START taking these medications   Details  ibuprofen (ADVIL,MOTRIN) 400 MG tablet Take 1 tablet (400 mg total) by mouth every 8 (eight) hours as needed for headache. Qty: 30 tablet, Refills: 0    sulfamethoxazole-trimethoprim (BACTRIM DS,SEPTRA DS) 800-160 MG tablet Take 1 tablet by mouth 2 (two) times daily. Qty: 18 tablet, Refills: 0      STOP taking these medications     mometasone-formoterol (DULERA) 100-5 MCG/ACT AERO         If you experience worsening of your admission symptoms, develop shortness of breath, life threatening emergency, suicidal or homicidal thoughts you must seek medical attention immediately by calling 911 or calling your MD  immediately  if symptoms less severe.  You Must read complete instructions/literature along with all the possible adverse reactions/side effects for all the Medicines you take and that have been prescribed to you. Take any new Medicines after you have completely understood and accept all the possible adverse reactions/side effects.   Please note  You were cared for by a hospitalist during your hospital stay. If you have any questions about your discharge medications or the care you received while you were in the hospital after you are discharged, you can call the unit and asked to speak with the hospitalist on call if the hospitalist that took care of you is not available. Once you are discharged, your primary care physician will handle any further medical issues. Please note that NO REFILLS for any discharge medications will be authorized once you are discharged, as it is imperative that you return to your primary care physician (or establish a relationship with a primary care physician if you do not have one) for your aftercare needs so that they can reassess your need for medications and monitor your lab values. Today   SUBJECTIVE   No new complaints able to ope eyes completely No difficulty swallowing  VITAL SIGNS:  Blood pressure (!) 101/49, pulse 72, temperature 98.1 F (36.7 C), temperature source Oral, resp. rate 16, height 5\' 9"  (1.753 m), weight 68 kg (150 lb), SpO2 97 %.  I/O:   Intake/Output Summary (Last 24 hours) at 01/18/17 1029 Last data filed at 01/18/17 0956  Gross per 24 hour  Intake             1729 ml  Output              200 ml  Net             1529 ml    PHYSICAL EXAMINATION:  GENERAL:  36 y.o.-year-old patient lying in the bed with no acute distress.  EYES: Pupils equal, round, reactive to light and accommodation. No scleral icterus. Extraocular muscles intact.  HEENT: Head atraumatic, normocephalic. Oropharynx and nasopharynx clear. Right facial scabbed lesion  . No discharge, mild swelling. Not pulsatile NECK:  Supple, no jugular venous distention. No thyroid enlargement, no tenderness.  LUNGS: Normal breath sounds bilaterally, no wheezing, rales,rhonchi or crepitation. No use of accessory muscles of respiration.  CARDIOVASCULAR: S1, S2 normal. No murmurs, rubs, or gallops.  ABDOMEN: Soft, non-tender, non-distended. Bowel sounds present. No organomegaly or mass.  EXTREMITIES: No pedal edema, cyanosis, or clubbing.  NEUROLOGIC: Cranial nerves II through XII are intact. Muscle strength 5/5 in all extremities. Sensation intact. Gait not checked.  PSYCHIATRIC: The patient is alert and oriented x 3.  SKIN: No obvious rash, lesion, or ulcer.   DATA REVIEW:   CBC   Recent Labs Lab 01/17/17 0508  WBC 14.8*  HGB 12.9*  HCT 37.9*  PLT 250    Chemistries   Recent Labs Lab 01/17/17 0508  NA 140  K 3.8  CL 106  CO2 31  GLUCOSE 102*  BUN 12  CREATININE 0.74  CALCIUM 8.3*    Microbiology Results   Recent Results (from the past 240 hour(s))  Wound or Superficial Culture     Status: None (Preliminary result)   Collection Time: 01/16/17  9:00 PM  Result Value Ref Range Status   Specimen Description FACE  Final   Special Requests Normal  Final   Gram Stain   Final    MODERATE WBC PRESENT, PREDOMINANTLY PMN FEW GRAM POSITIVE COCCI IN CLUSTERS    Culture   Final    TOO YOUNG TO READ Performed at [facility], [address] [place], [place] [address]    Report Status PENDING  Incomplete    RADIOLOGY:  No results found.   Management plans discussed with the patient, family and they are in agreement.  CODE STATUS:     Code Status Orders        Start     Ordered   01/16/17 2215  Full code  Continuous     01/16/17 2214    Code Status History    Date Active Date Inactive Code Status Order ID Comments User Context   This patient has a current code status but no historical code status.      TOTAL TIME TAKING CARE  OF THIS PATIENT: *40* minutes.    [name] M.D on 01/18/2017 at 10:29 AM  Between 7am to 6pm - Pager - [phone] After 6pm go to [email or web address] - password [email or web address]  [facility] [place] [facility]  Office  [phone]  CC: Primary care physician; Patient, No Pcp Per

## 2017-01-18 NOTE — Progress Notes (Signed)
Pt d/c to home today.  IV removed intact.  Rx's given to pt w/all questions and concerns addressed.  D/C paperwork reviewed and education provided with all questions and concerns addressed.  Pt mother at bedside for home transport.   

## 2017-01-19 LAB — AEROBIC CULTURE  (SUPERFICIAL SPECIMEN): SPECIAL REQUESTS: NORMAL

## 2017-01-19 LAB — AEROBIC CULTURE W GRAM STAIN (SUPERFICIAL SPECIMEN)

## 2018-01-21 ENCOUNTER — Telehealth: Payer: Self-pay | Admitting: Internal Medicine

## 2018-01-21 ENCOUNTER — Encounter: Payer: Self-pay | Admitting: Internal Medicine

## 2018-01-21 NOTE — Telephone Encounter (Signed)
3 attempts to schedule fu appt from recall list.   Deleting recall.  °Mailed Letter  °

## 2020-06-30 ENCOUNTER — Emergency Department: Admission: EM | Admit: 2020-06-30 | Discharge: 2020-06-30 | Discharge status: Against Medical Advice | Payer: Self-pay

## 2020-06-30 ENCOUNTER — Other Ambulatory Visit: Payer: Self-pay
